# Patient Record
Sex: Female | Born: 1985 | Race: Black or African American | Hispanic: No | Marital: Single | Smoking: Current every day smoker
Health system: Southern US, Community
[De-identification: ages and names within clinical notes are randomized; demographics above are authoritative.]

## PROBLEM LIST (undated history)

## (undated) HISTORY — PX: CHOLECYSTECTOMY: SHX55

---

## 2004-09-20 ENCOUNTER — Inpatient Hospital Stay: Payer: Self-pay | Admitting: Obstetrics and Gynecology

## 2005-03-06 ENCOUNTER — Emergency Department: Payer: Self-pay | Admitting: Unknown Physician Specialty

## 2006-03-18 ENCOUNTER — Emergency Department: Payer: Self-pay | Admitting: Emergency Medicine

## 2006-05-22 ENCOUNTER — Inpatient Hospital Stay: Payer: Self-pay | Admitting: Internal Medicine

## 2006-05-25 ENCOUNTER — Emergency Department: Payer: Self-pay | Admitting: Emergency Medicine

## 2006-05-31 ENCOUNTER — Ambulatory Visit: Payer: Self-pay | Admitting: General Surgery

## 2010-05-26 ENCOUNTER — Observation Stay: Payer: Self-pay | Admitting: Obstetrics and Gynecology

## 2010-05-29 ENCOUNTER — Emergency Department: Payer: Self-pay | Admitting: Emergency Medicine

## 2010-05-31 ENCOUNTER — Ambulatory Visit: Payer: Self-pay | Admitting: Advanced Practice Midwife

## 2010-06-17 ENCOUNTER — Observation Stay: Payer: Self-pay

## 2010-07-20 ENCOUNTER — Ambulatory Visit: Payer: Self-pay | Admitting: Obstetrics and Gynecology

## 2011-06-13 ENCOUNTER — Ambulatory Visit: Payer: Self-pay | Admitting: Family Medicine

## 2011-07-24 ENCOUNTER — Observation Stay: Payer: Self-pay | Admitting: Obstetrics and Gynecology

## 2011-07-24 ENCOUNTER — Emergency Department: Payer: Self-pay | Admitting: Unknown Physician Specialty

## 2011-08-22 ENCOUNTER — Observation Stay: Payer: Self-pay | Admitting: Obstetrics and Gynecology

## 2011-09-13 ENCOUNTER — Ambulatory Visit: Payer: Self-pay | Admitting: Obstetrics and Gynecology

## 2011-09-13 LAB — CBC WITH DIFFERENTIAL/PLATELET
Basophil #: 0 10*3/uL (ref 0.0–0.1)
Basophil %: 0.1 %
HCT: 38.3 % (ref 35.0–47.0)
HGB: 12.6 g/dL (ref 12.0–16.0)
Lymphocyte #: 1.8 10*3/uL (ref 1.0–3.6)
MCV: 83 fL (ref 80–100)
Monocyte %: 7.1 %
Neutrophil #: 10.1 10*3/uL — ABNORMAL HIGH (ref 1.4–6.5)
RDW: 15.6 % — ABNORMAL HIGH (ref 11.5–14.5)

## 2011-09-14 ENCOUNTER — Inpatient Hospital Stay: Payer: Self-pay

## 2011-09-15 LAB — HEMATOCRIT: HCT: 35.1 % (ref 35.0–47.0)

## 2011-09-18 LAB — PATHOLOGY REPORT

## 2014-11-28 NOTE — Op Note (Signed)
PATIENT NAME:  Gina Newman, Gina Newman MR#:  [id number] DATE OF BIRTH:  Jan 18, 1986  DATE OF PROCEDURE:  09/14/2011  PREOPERATIVE DIAGNOSES:  1. Elective repeat cesarean section, [redacted] weeks gestation.  2. Elective permanent sterilization.   POSTOPERATIVE DIAGNOSES:  1. Elective repeat cesarean section, [redacted] weeks gestation.  2. Elective permanent sterilization.   PROCEDURES:  1. Repeat low transverse cesarean section. 2. Pomeroy bilateral tubal ligation. 3. On-Q pump system placement   ANESTHESIA: Spinal.   SURGEON: Suzy Bouchardhomas J. Hamed Debella, M.D.   FIRST ASSISTANT: Brame - scrub tech.   INDICATIONS: This is a 29 year old gravida 3, para 2, the patient is at 3739 weeks gestation. The patient elected for repeat cesarean section and elective permanent sterilization. Medicaid consent form was signed on 07/23/2011.   DESCRIPTION OF PROCEDURE:  After adequate spinal anesthesia, the patient was placed in the dorsal supine position with a hip roll under the right side. The patient's abdomen was prepped and draped in normal sterile fashion. A low transverse Pfannenstiel incision was made. Sharp dissection was used to identify the fascia, the fascia was opened in the midline and opened in a transverse fashion. The superior aspect of the fascia was grasped with Kocher clamps and the recti muscles dissected free. The inferior aspect of the fascia was grasped with Kocher clamps, and the pyramidalis muscle was dissected free. The peritoneal cavity was entered sharply. Vesicouterine peritoneal fold was identified and opened and bladder was reflected inferiorly. A low transverse uterine incision was made. Upon entry into the endometrial cavity, clear fluid resulted. The incision was extended with blunt transverse traction. The fetal head was delivered through the incision without difficulty. A vigorous female was passed to the neonatologist, Dr. Beckie Saltsasnadi, who assigned Apgar scores of 8 and 9. The placenta was manually  delivered. The uterus was exteriorized and wiped clean with laparotomy tape. A ring forceps was used to open the cervix and this was passed off the operative field. The uterine incision was closed with one chromic suture in a running locking fashion with good approximation of edges. Good hemostasis was noted. Attention was directed to the patient's right fallopian tube which was grasped at the midportion of the fallopian tube and two separate 0 plain gut sutures were placed on the fallopian tube. A 1.5 cm portion of the fallopian tube was removed. A similar procedure was repeated on the patient's left fallopian tube, after placing two separate 0 plain gut sutures, and a 1.5 cm portion of the fallopian tube was removed. There were two small areas bilaterally that were oozing, that required a figure-of-eight 3-0 Vicryl suture. Good hemostasis was noted. The posterior cul-de-sac was irrigated and suctioned. The uterus was placed back into the abdominal cavity. The paracolic gutters were wiped clean with laparotomy tape. The tubal ligation sites appeared hemostatic bilaterally. The uterine incision was hemostatic as well. Interceed was placed over the uterine incision, in a T-shaped fashion. The On-Q pump system was then brought up to the operative field and two separate catheters were placed infraumbilically. These catheters were run subfascially. Once in place, the fascia was closed over top of the catheters with 0 Vicryl suture in a running nonlocking fashion with good approximation of edges. The subcutaneous tissues were irrigated and bovied. There were some releasing incisions made essentially due to the patient's history of a prior vertical skin incision. Good hemostasis was noted. The skin was reapproximated with staples. The catheters were secured at the skin with Dermabond, secured with a Tegaderm, and were loaded  in the standard fashion with 5 mL to each catheter of 0.5% Marcaine. The patient tolerated the  procedure well. Estimated blood loss 400 mL. Intraoperative fluids 1200 mL. The patient did receive 2 grams IV of Ancef prior to commencement of the case. A Foley catheter was placed at the time of surgery as well. ____________________________ Suzy Bouchard, MD tjs:slb D: 09/14/2011 08:47:08 ET T: 09/14/2011 09:49:18 ET JOB#: [id number]  cc: Suzy Bouchard, MD, <Dictator> Suzy Bouchard MD ELECTRONICALLY SIGNED 09/14/2011 14:15

## 2014-11-28 NOTE — Discharge Summary (Signed)
PATIENT NAME:  Gina Newman, [name] MR#:  [id number] DATE OF BIRTH:  March 07, 1986  DATE OF ADMISSION:  09/14/2011 DATE OF DISCHARGE:  09/17/2011  PRINCIPLE PROCEDURES: Elective repeat cesarean section and bilateral tubal ligation.   HOSPITAL COURSE: The patient underwent the above procedure, which was uncomplicated, with a postoperative day number one hematocrit of 35.1. She was discharged to home postoperative day number three. Staples were removed and Steri-Strips applied.   DISCHARGE MEDICATIONS: Vicodin.   DISCHARGE FOLLOWUP: The patient will follow-up with Dr. Feliberto GottronSchermerhorn in two weeks for wound care or before if she has wound drainage, fever, or increasing abdominal pain.  ____________________________ Suzy Bouchardhomas J. Wallace Cogliano, MD tjs:slb D: 09/24/2011 10:56:56 ET T: 09/24/2011 11:06:09 ET JOB#: [id number]  cc: Suzy Bouchardhomas J. Allyah Heather, MD, <Dictator> Suzy BouchardHOMAS J Lael Wetherbee MD ELECTRONICALLY SIGNED 09/26/2011 9:00

## 2018-02-17 ENCOUNTER — Emergency Department
Admission: EM | Admit: 2018-02-17 | Discharge: 2018-02-17 | Disposition: A | Discharge status: Home / Self Care | Payer: Self-pay | Attending: Emergency Medicine | Admitting: Emergency Medicine

## 2018-02-17 ENCOUNTER — Other Ambulatory Visit: Payer: Self-pay

## 2018-02-17 ENCOUNTER — Emergency Department: Payer: Self-pay

## 2018-02-17 DIAGNOSIS — R0789 Other chest pain: Secondary | ICD-10-CM | POA: Insufficient documentation

## 2018-02-17 DIAGNOSIS — Z9049 Acquired absence of other specified parts of digestive tract: Secondary | ICD-10-CM | POA: Insufficient documentation

## 2018-02-17 LAB — BASIC METABOLIC PANEL
ANION GAP: 8 (ref 5–15)
BUN: 7 mg/dL (ref 6–20)
CALCIUM: 9.5 mg/dL (ref 8.9–10.3)
CHLORIDE: 108 mmol/L (ref 98–111)
CO2: 24 mmol/L (ref 22–32)
Creatinine, Ser: 0.66 mg/dL (ref 0.44–1.00)
GFR calc non Af Amer: >60 mL/min (ref >60)
GLUCOSE: 107 mg/dL — AB (ref 70–99)
Potassium: 3.9 mmol/L (ref 3.5–5.1)
Sodium: 140 mmol/L (ref 135–145)

## 2018-02-17 LAB — CBC
HCT: 38.3 % (ref 35.0–47.0)
HEMOGLOBIN: 13 g/dL (ref 12.0–16.0)
MCH: 27 pg (ref 26.0–34.0)
MCHC: 34 g/dL (ref 32.0–36.0)
MCV: 79.4 fL — AB (ref 80.0–100.0)
Platelets: 319 10*3/uL (ref 150–440)
RBC: 4.83 MIL/uL (ref 3.80–5.20)
RDW: 13.8 % (ref 11.5–14.5)
WBC: 7.7 10*3/uL (ref 3.6–11.0)

## 2018-02-17 LAB — TROPONIN I

## 2018-02-17 MED ORDER — RANITIDINE HCL 150 MG PO CAPS: 150.0000 mg | ORAL_CAPSULE | Freq: Two times a day (BID) | ORAL | 0 refills | Status: AC

## 2018-02-17 MED ORDER — METOCLOPRAMIDE HCL 10 MG PO TABS: 10.0000 mg | ORAL_TABLET | Freq: Four times a day (QID) | ORAL | 0 refills | Status: DC | PRN

## 2018-02-17 NOTE — ED Provider Notes (Signed)
[facility] Emergency Department Provider Note  ____________________________________________  Time seen: Approximately 7:50 PM  I have reviewed the triage vital signs and the nursing notes.   HISTORY  Chief Complaint Chest Pain    HPI Gina Newman is a 32 y.o. female with no significant past medical history who complains of chest pain described as tightness in the central chest, started at 10 AM today, constant, better sitting upright, not exertional, not pleuritic.  No shortness of breath diaphoresis or vomiting.  Feeling better just in the last 2 hours.  Nonradiating.  Had similar pain like this about a month ago after eating barbecue.      History reviewed. No pertinent past medical history.   There are no active problems to display for this patient.    Past Surgical History:  Procedure Laterality Date  . CHOLECYSTECTOMY       Prior to Admission medications   Medication Sig Start Date End Date Taking? Authorizing Provider  metoCLOPramide (REGLAN) 10 MG tablet Take 1 tablet (10 mg total) by mouth every 6 (six) hours as needed. 02/17/18   Sharman CheekStafford, Cinthia Rodden, MD  ranitidine (ZANTAC) 150 MG capsule Take 1 capsule (150 mg total) by mouth 2 (two) times daily. 02/17/18   Sharman CheekStafford, Leighla Chestnutt, MD     Allergies Patient has no allergy information on record.   History reviewed. No pertinent family history.  Social History Social History   Tobacco Use  . Smoking status: Never Smoker  Substance Use Topics  . Alcohol use: Never    Frequency: Never  . Drug use: Never    Review of Systems  Constitutional:   No fever or chills.  ENT:   No sore throat. No rhinorrhea. Cardiovascular:   Positive as above chest pain without  syncope. Respiratory:   No dyspnea or cough. Gastrointestinal:   Negative for abdominal pain, vomiting and diarrhea.  Musculoskeletal:   Negative for focal pain or swelling All other systems reviewed and are negative except as  documented above in ROS and HPI.  ____________________________________________   PHYSICAL EXAM:  VITAL SIGNS: ED Triage Vitals  Enc Vitals Group     BP 02/17/18 1720 132/68     Pulse Rate 02/17/18 1720 60     Resp 02/17/18 1720 18     Temp 02/17/18 1720 97.7 F (36.5 C)     Temp Source 02/17/18 1720 Oral     SpO2 02/17/18 1720 99 %     Weight 02/17/18 1719 200 lb (90.7 kg)     Height 02/17/18 1719 5\' 7"  (1.702 m)     Head Circumference --      Peak Flow --      Pain Score 02/17/18 1719 8     Pain Loc --      Pain Edu? --      Excl. in GC? --     Vital signs reviewed, nursing assessments reviewed.   Constitutional:   Alert and oriented. Non-toxic appearance. Eyes:   Conjunctivae are normal. EOMI. PERRL. ENT      Head:   Normocephalic and atraumatic.      Nose:   No congestion/rhinnorhea.       Mouth/Throat:   MMM, no pharyngeal erythema. No peritonsillar mass.       Neck:   No meningismus. Full ROM. Hematological/Lymphatic/Immunilogical:   No cervical lymphadenopathy. Cardiovascular:   RRR. Symmetric bilateral radial and DP pulses.  No murmurs. Cap refill less than 2 seconds. Respiratory:   Normal respiratory  effort without tachypnea/retractions. Breath sounds are clear and equal bilaterally. No wheezes/rales/rhonchi. Gastrointestinal:   Soft and nontender. Non distended. There is no CVA tenderness.  No rebound, rigidity, or guarding. Genitourinary:   deferred Musculoskeletal:   Normal range of motion in all extremities. No joint effusions.  No lower extremity tenderness.  No edema. Neurologic:   Normal speech and language.  Motor grossly intact. No acute focal neurologic deficits are appreciated.  Skin:    Skin is warm, dry and intact. No rash noted.  No petechiae, purpura, or bullae.  ____________________________________________    LABS (pertinent positives/negatives) (all labs ordered are listed, but only abnormal results are displayed) Labs Reviewed  BASIC  METABOLIC PANEL - Abnormal; Notable for the following components:      Result Value   Glucose, Bld 107 (*)    All other components within normal limits  CBC - Abnormal; Notable for the following components:   MCV 79.4 (*)    All other components within normal limits  TROPONIN I  POC URINE PREG, ED   ____________________________________________   EKG  Interpreted by me Normal sinus rhythm rate of 60, normal axis and intervals.  Normal QRS ST segments and T waves.  ____________________________________________    RADIOLOGY  Dg Chest 2 View  Result Date: 02/17/2018 CLINICAL DATA:  Left-sided chest pain and dyspnea today. EXAM: CHEST - 2 VIEW COMPARISON:  None. FINDINGS: The heart size and mediastinal contours are within normal limits. Both lungs are clear. The visualized skeletal structures are unremarkable. IMPRESSION: No active cardiopulmonary disease. Electronically Signed   By: Tollie Eth M.D.   On: 02/17/2018 17:53    ____________________________________________   PROCEDURES Procedures  ____________________________________________    CLINICAL IMPRESSION / ASSESSMENT AND PLAN / ED COURSE  Pertinent labs & imaging results that were available during my care of the patient were reviewed by me and considered in my medical decision making (see chart for details).    Patient presents with atypical chest pain.  Currently feeling better, vital signs are normal.  EKG unremarkable, labs and chest x-ray normal.Considering the patient's symptoms, medical history, and physical examination today, I have low suspicion for ACS, PE, TAD, pneumothorax, carditis, mediastinitis, pneumonia, CHF, or sepsis.  Most likely GERD.  Trial of Reglan and Pepcid.  Follow-up with primary care.  Return precautions.      ____________________________________________   FINAL CLINICAL IMPRESSION(S) / ED DIAGNOSES    Final diagnoses:  Atypical chest pain     ED Discharge Orders         Ordered    metoCLOPramide (REGLAN) 10 MG tablet  Every 6 hours PRN     02/17/18 1950    ranitidine (ZANTAC) 150 MG capsule  2 times daily     02/17/18 1950      Portions of this note were generated with dragon dictation software. Dictation errors may occur despite best attempts at proofreading.    Sharman Cheek, MD 02/17/18 [phone]

## 2018-02-17 NOTE — ED Triage Notes (Signed)
Pt arrives to ED for CP that began around 10am today. Pt had just woken up. States L sided. Denies radiation. Also states SOB. No SOB or increased WOB noted. Denies asthma, CHF, COPD.

## 2018-09-28 ENCOUNTER — Other Ambulatory Visit: Payer: Self-pay

## 2018-09-28 ENCOUNTER — Emergency Department
Admission: EM | Admit: 2018-09-28 | Discharge: 2018-09-28 | Disposition: A | Discharge status: Home / Self Care | Payer: Medicaid Other | Attending: Emergency Medicine | Admitting: Emergency Medicine

## 2018-09-28 DIAGNOSIS — J101 Influenza due to other identified influenza virus with other respiratory manifestations: Secondary | ICD-10-CM

## 2018-09-28 DIAGNOSIS — J1089 Influenza due to other identified influenza virus with other manifestations: Secondary | ICD-10-CM | POA: Insufficient documentation

## 2018-09-28 DIAGNOSIS — F17210 Nicotine dependence, cigarettes, uncomplicated: Secondary | ICD-10-CM | POA: Insufficient documentation

## 2018-09-28 LAB — INFLUENZA PANEL BY PCR (TYPE A & B)
INFLBPCR: NEGATIVE
Influenza A By PCR: POSITIVE — AB

## 2018-09-28 MED ORDER — BUTALBITAL-APAP-CAFFEINE 50-325-40 MG PO TABS
1.0000 | ORAL_TABLET | Freq: Once | ORAL | Status: AC
  Administered 2018-09-28: 1 via ORAL
  Filled 2018-09-28: qty 1

## 2018-09-28 MED ORDER — BUTALBITAL-APAP-CAFFEINE 50-325-40 MG PO TABS: 1.0000 | ORAL_TABLET | Freq: Four times a day (QID) | ORAL | 0 refills | Status: AC | PRN

## 2018-09-28 NOTE — ED Triage Notes (Signed)
Patient to ED for flu like symptoms for 1 week. Son at home who is 35 years old was treated last week for type A. Patient states she has body aches and nausea but denies diarrhea. Decreased appetite over the last day.

## 2018-09-28 NOTE — ED Provider Notes (Signed)
[facility] Emergency Department Provider Note  ____________________________________________  Time seen: Approximately 9:13 PM  I have reviewed the triage vital signs and the nursing notes.   HISTORY  Chief Complaint Influenza (x 1 week)   HPI Tinea N Mears is a 33 y.o. female presents to the emergency department for treatment and evaluation for flulike symptoms for the past week or so.  She states that overall the symptoms have improved with the exception of the headache.  She states that her head hurts much worse when she coughs.  She states that the cough is becoming less frequent and she is becoming less congested.  She has taken Tylenol, ibuprofen, NyQuil, TheraFlu, and DayQuil without relief of the headache.    History reviewed. No pertinent past medical history.  There are no active problems to display for this patient.   Past Surgical History:  Procedure Laterality Date  . CHOLECYSTECTOMY      Prior to Admission medications   Medication Sig Start Date End Date Taking? Authorizing Provider  butalbital-acetaminophen-caffeine (FIORICET, ESGIC) 50-325-40 MG tablet Take 1 tablet by mouth every 6 (six) hours as needed for up to 3 days for headache. 09/28/18 10/01/18  Katalin Colledge, Rulon Eisenmenger B, FNP  metoCLOPramide (REGLAN) 10 MG tablet Take 1 tablet (10 mg total) by mouth every 6 (six) hours as needed. 02/17/18   Sharman Cheek, MD  ranitidine (ZANTAC) 150 MG capsule Take 1 capsule (150 mg total) by mouth 2 (two) times daily. 02/17/18   Sharman Cheek, MD    Allergies Patient has no known allergies.  No family history on file.  Social History Social History   Tobacco Use  . Smoking status: Current Every Day Smoker  Substance Use Topics  . Alcohol use: Not Currently    Frequency: Never  . Drug use: Never    Review of Systems Constitutional: Negative for fever/chills.  Normal appetite. ENT: Negative for sore throat. Cardiovascular: Denies chest  pain. Respiratory: Negative for shortness of breath.  Positive for cough.  Negative wheezing.  Gastrointestinal: Negative for nausea, no vomiting.  Negative for diarrhea.  Musculoskeletal: Positive for body aches Skin: Negative for rash. Neurological: Positive for headaches ____________________________________________   PHYSICAL EXAM:  VITAL SIGNS: ED Triage Vitals  Enc Vitals Group     BP 09/28/18 1953 125/66     Pulse Rate 09/28/18 1953 80     Resp 09/28/18 1953 18     Temp 09/28/18 1953 98.6 F (37 C)     Temp Source 09/28/18 1953 Oral     SpO2 09/28/18 1953 98 %     Weight 09/28/18 1954 200 lb (90.7 kg)     Height 09/28/18 1954 5\' 6"  (1.676 m)     Head Circumference --      Peak Flow --      Pain Score 09/28/18 1953 4     Pain Loc --      Pain Edu? --      Excl. in GC? --     Constitutional: Alert and oriented.  Well appearing and in no acute distress. Eyes: Conjunctivae are normal. Ears: Bilateral tympanic membranes are normal Nose: Maxillary sinus congestion noted; no rhinnorhea. Mouth/Throat: Mucous membranes are moist.  Oropharynx clear. Tonsils not visualized. Uvula midline. Neck: No stridor.  Lymphatic: No cervical lymphadenopathy. Cardiovascular: Normal rate, regular rhythm. Good peripheral circulation. Respiratory: Respirations are even and unlabored.  No retractions.  Breath sounds clear to auscultation. Gastrointestinal: Soft and nontender.  Musculoskeletal: FROM x 4 extremities.  Neurologic:  Normal speech and language. Skin:  Skin is warm, dry and intact. No rash noted. Psychiatric: Mood and affect are normal. Speech and behavior are normal.  ____________________________________________   LABS (all labs ordered are listed, but only abnormal results are displayed)  Labs Reviewed  INFLUENZA PANEL BY PCR (TYPE A & B) - Abnormal; Notable for the following components:      Result Value   Influenza A By PCR POSITIVE (*)    All other components within  normal limits   ____________________________________________  EKG  Not indicated ____________________________________________  RADIOLOGY  Not indicated ____________________________________________   PROCEDURES  Procedure(s) performed: None  Critical Care performed: No ____________________________________________   INITIAL IMPRESSION / ASSESSMENT AND PLAN / ED COURSE  33 y.o. female presents to the emergency department for treatment and evaluation of URIs and flulike symptoms.  Her son has had the flu this past week.  Her symptoms developed almost a week ago as well and have nearly resolved with the exception of some lingering cough and congestion and headache.  Headache will be treated with Fioricet.  She was offered cough meds but declines and states that she can just take over-the-counter meds which has helped.  Patient was encouraged to see primary care for symptoms that are not improving over the next few days.  She was encouraged to return to the emergency department if the headache changes or worsens.    Medications  butalbital-acetaminophen-caffeine (FIORICET, ESGIC) 50-325-40 MG per tablet 1 tablet (1 tablet Oral Given 09/28/18 2113)    ED Discharge Orders         Ordered    butalbital-acetaminophen-caffeine Marikay Alar, ESGIC) 50-325-40 MG tablet  Every 6 hours PRN     09/28/18 2109           Pertinent labs & imaging results that were available during my care of the patient were reviewed by me and considered in my medical decision making (see chart for details).    If controlled substance prescribed during this visit, 12 month history viewed on the [facility] prior to issuing an initial prescription for Schedule II or III opiod. ____________________________________________   FINAL CLINICAL IMPRESSION(S) / ED DIAGNOSES  Final diagnoses:  Influenza A    Note:  This document was prepared using Dragon voice recognition software and may include unintentional  dictation errors.     Chinita Pester, FNP 09/28/18 2116    Sharman Cheek, MD 10/03/18 [phone]

## 2020-02-20 IMAGING — CR DG CHEST 2V
2 series · 2 of 2 positions shown · non-contrast
Comparison: None.

CLINICAL DATA: Left-sided chest pain and dyspnea today.

EXAM:
CHEST - 2 VIEW

[chest pa]
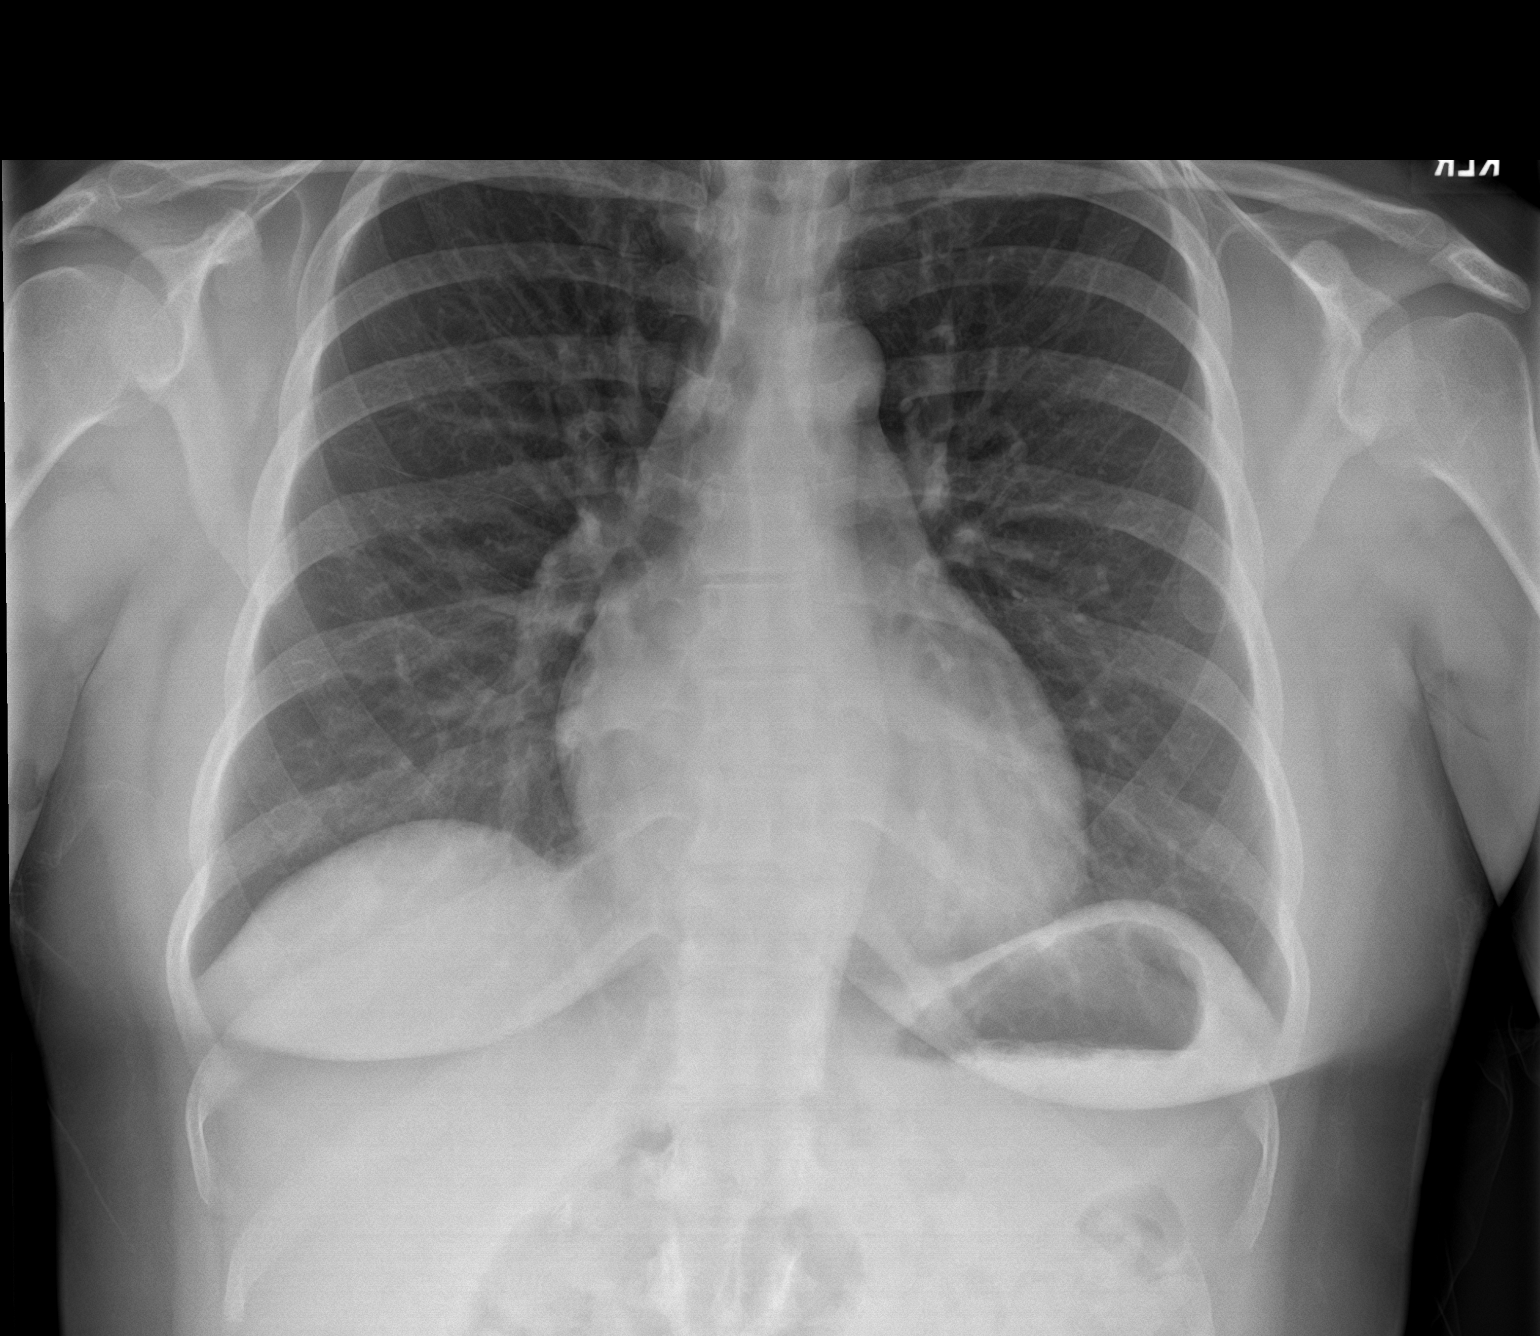

[chest lat]
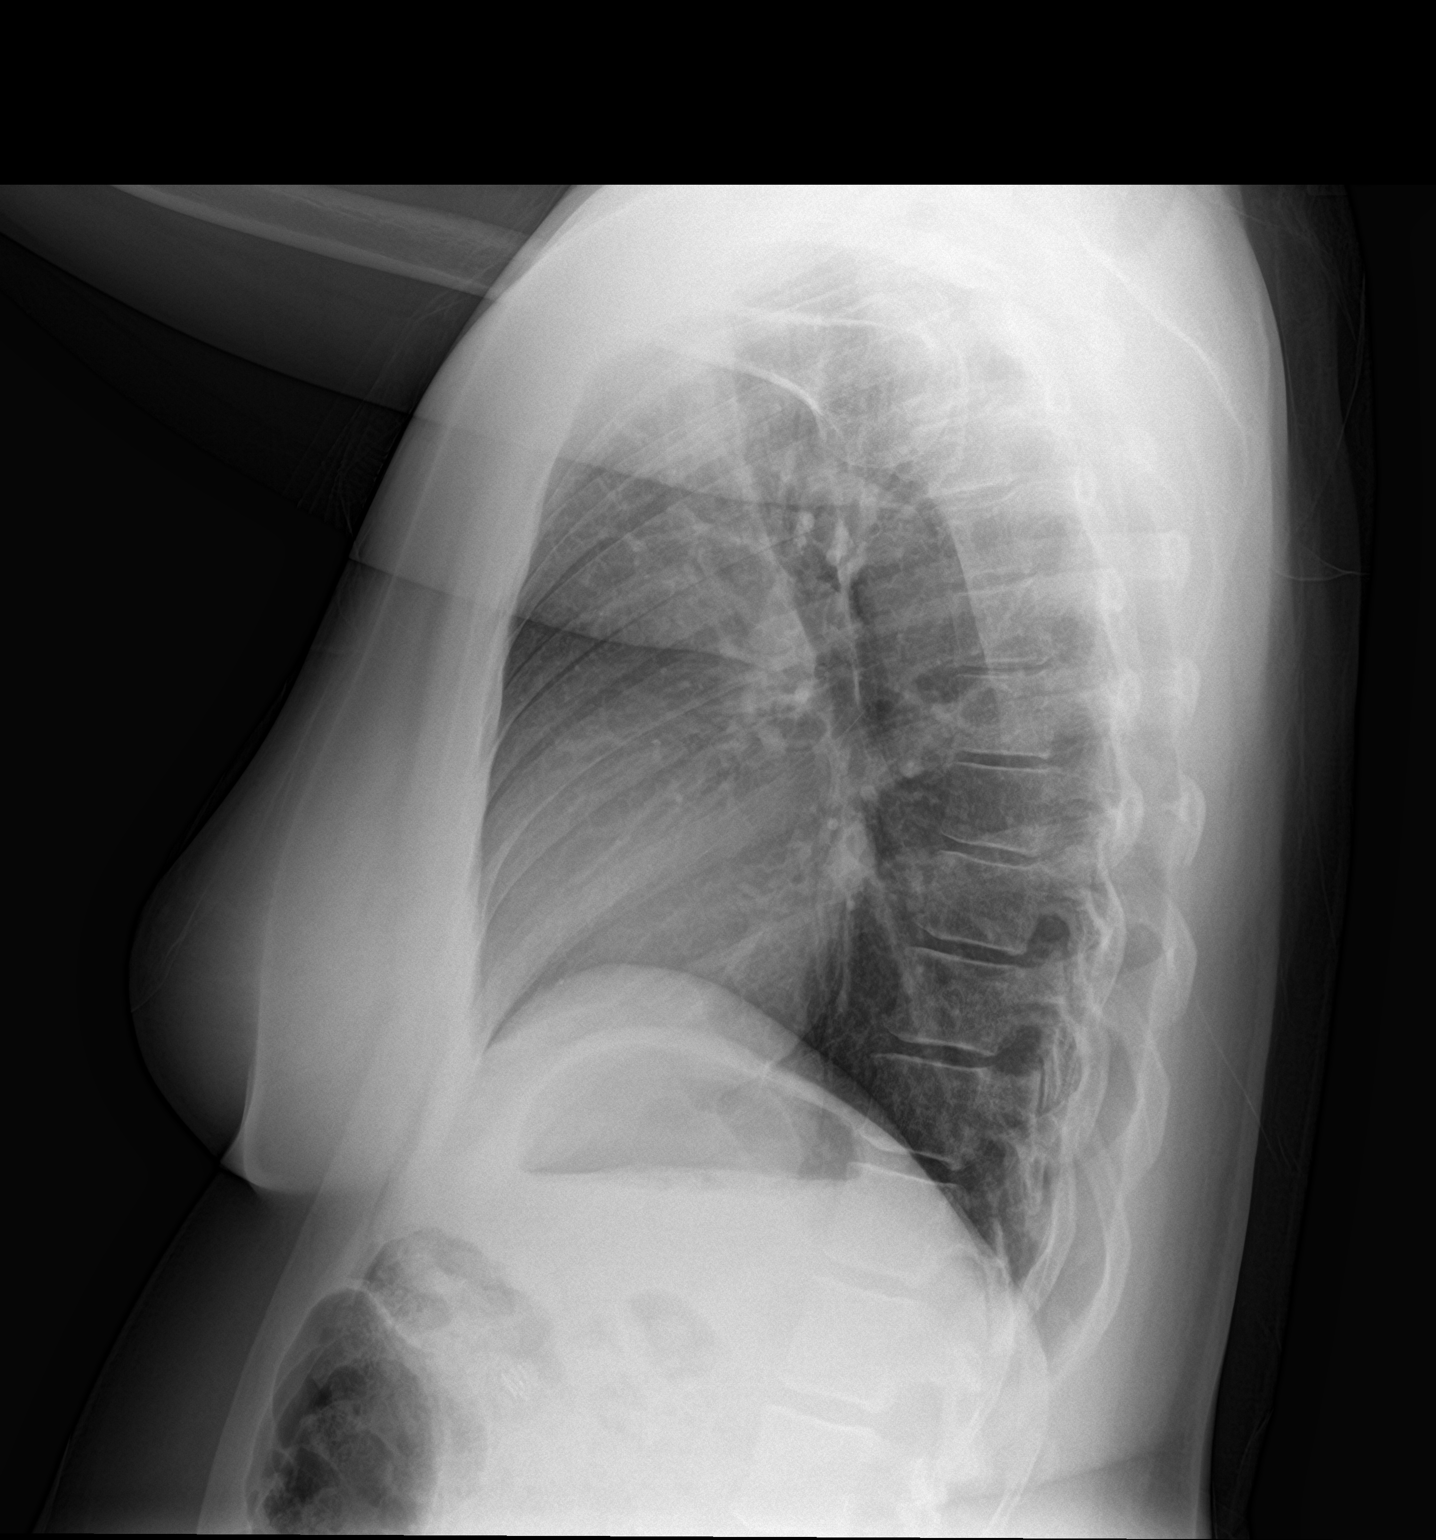

[2 of 2 positions shown; findings below may reference images not displayed]

FINDINGS: The heart size and mediastinal contours are within normal limits.
Both lungs are clear. The visualized skeletal structures are
unremarkable.
IMPRESSION: No active cardiopulmonary disease.

## 2020-07-16 ENCOUNTER — Emergency Department
Admission: EM | Admit: 2020-07-16 | Discharge: 2020-07-16 | Disposition: A | Discharge status: Home / Self Care | Payer: Medicaid Other | Attending: Emergency Medicine | Admitting: Emergency Medicine

## 2020-07-16 ENCOUNTER — Other Ambulatory Visit: Payer: Self-pay

## 2020-07-16 ENCOUNTER — Emergency Department: Payer: Medicaid Other

## 2020-07-16 ENCOUNTER — Encounter: Payer: Self-pay | Admitting: Emergency Medicine

## 2020-07-16 DIAGNOSIS — R6883 Chills (without fever): Secondary | ICD-10-CM | POA: Diagnosis present

## 2020-07-16 DIAGNOSIS — Z5321 Procedure and treatment not carried out due to patient leaving prior to being seen by health care provider: Secondary | ICD-10-CM | POA: Insufficient documentation

## 2020-07-16 DIAGNOSIS — U071 COVID-19: Secondary | ICD-10-CM | POA: Insufficient documentation

## 2020-07-16 DIAGNOSIS — M791 Myalgia, unspecified site: Secondary | ICD-10-CM | POA: Insufficient documentation

## 2020-07-16 DIAGNOSIS — R531 Weakness: Secondary | ICD-10-CM | POA: Insufficient documentation

## 2020-07-16 DIAGNOSIS — F17200 Nicotine dependence, unspecified, uncomplicated: Secondary | ICD-10-CM | POA: Diagnosis not present

## 2020-07-16 DIAGNOSIS — R509 Fever, unspecified: Secondary | ICD-10-CM | POA: Diagnosis present

## 2020-07-16 LAB — RESP PANEL BY RT-PCR (FLU A&B, COVID) ARPGX2
Influenza A by PCR: NEGATIVE
Influenza B by PCR: NEGATIVE
SARS Coronavirus 2 by RT PCR: POSITIVE — AB

## 2020-07-16 MED ORDER — DICYCLOMINE HCL 10 MG PO CAPS: 10.0000 mg | ORAL_CAPSULE | Freq: Three times a day (TID) | ORAL | 0 refills | Status: AC

## 2020-07-16 MED ORDER — ONDANSETRON 4 MG PO TBDP: 4.0000 mg | ORAL_TABLET | Freq: Three times a day (TID) | ORAL | 0 refills | Status: AC | PRN

## 2020-07-16 NOTE — ED Notes (Signed)
Th RN was speaking to a different patient and this patient asked front desk staff to cut her bracelet off because she was leaving.

## 2020-07-16 NOTE — ED Triage Notes (Signed)
Fever, cough, body aches since Wednesday.  AAOx3.  Skin warm and dry. NAD

## 2020-07-16 NOTE — ED Notes (Addendum)
Pt c/o cough producing green phlegm, chills, body aches, and fatigue since Thanksgiving. Pt states son had similar s/s. Pt denies SOB, N/V/D. Pt AOX4, NAD noted. Lung sounds clear bilaterally, no cough noted at this time. Nasal congestion noted

## 2020-07-16 NOTE — ED Notes (Signed)
Annice Pih PA notified of +COVID

## 2020-07-16 NOTE — ED Provider Notes (Signed)
Emergency Department Provider Note  ____________________________________________  Time seen: Approximately 3:12 PM  I have reviewed the triage vital signs and the nursing notes.   HISTORY  Chief Complaint Fever and Cough   Historian Patient     HPI Gina Newman is a 34 y.o. female presents to the emergency department with productive cough, chills, body aches and fatigue.  Patient states her symptoms originally started around Thanksgiving and seemed to improve but she states that symptoms have worsened again.  She denies vomiting or diarrhea.  No chest pain, chest tightness or shortness of breath.  No recent travel.  There are sick contacts in the home with similar symptoms.   No past medical history on file.   Immunizations up to date:  Yes.     No past medical history on file.  There are no problems to display for this patient.   Past Surgical History:  Procedure Laterality Date  . CHOLECYSTECTOMY      Prior to Admission medications   Medication Sig Start Date End Date Taking? Authorizing Provider  dicyclomine (BENTYL) 10 MG capsule Take 1 capsule (10 mg total) by mouth 4 (four) times daily -  before meals and at bedtime for 5 days. 07/16/20 07/21/20  Orvil Feil, PA-C  metoCLOPramide (REGLAN) 10 MG tablet Take 1 tablet (10 mg total) by mouth every 6 (six) hours as needed. 02/17/18   Sharman Cheek, MD  ondansetron (ZOFRAN ODT) 4 MG disintegrating tablet Take 1 tablet (4 mg total) by mouth every 8 (eight) hours as needed for up to 5 days. 07/16/20 07/21/20  Orvil Feil, PA-C  ranitidine (ZANTAC) 150 MG capsule Take 1 capsule (150 mg total) by mouth 2 (two) times daily. 02/17/18   Sharman Cheek, MD    Allergies Patient has no known allergies.  No family history on file.  Social History Social History   Tobacco Use  . Smoking status: Current Every Day Smoker  . Smokeless tobacco: Never Used  Substance Use Topics  . Alcohol use: Not  Currently  . Drug use: Never     Review of Systems  Constitutional: Patient has fever.  Eyes: No visual changes. No discharge ENT: Patient has congestion.  Cardiovascular: no chest pain. Respiratory: Patient has cough.  Gastrointestinal: No abdominal pain.  No nausea, no vomiting. Patient had diarrhea.  Genitourinary: Negative for dysuria. No hematuria Musculoskeletal: Patient has myalgias.  Skin: Negative for rash, abrasions, lacerations, ecchymosis. Neurological: Patient has headache, no focal weakness or numbness.     ____________________________________________   PHYSICAL EXAM:  VITAL SIGNS: ED Triage Vitals  Enc Vitals Group     BP 07/16/20 1433 113/65     Pulse Rate 07/16/20 1433 70     Resp 07/16/20 1433 16     Temp 07/16/20 1433 98.7 F (37.1 C)     Temp Source 07/16/20 1433 Oral     SpO2 07/16/20 1433 97 %     Weight 07/16/20 1432 220 lb 0.3 oz (99.8 kg)     Height 07/16/20 1432 5\' 7"  (1.702 m)     Head Circumference --      Peak Flow --      Pain Score 07/16/20 1431 5     Pain Loc --      Pain Edu? --      Excl. in GC? --      Constitutional: Alert and oriented. Patient is lying supine. Eyes: Conjunctivae are normal. PERRL. EOMI. Head: Atraumatic. ENT:  Ears: Tympanic membranes are mildly injected with mild effusion bilaterally.       Nose: No congestion/rhinnorhea.      Mouth/Throat: Mucous membranes are moist. Posterior pharynx is mildly erythematous.  Hematological/Lymphatic/Immunilogical: No cervical lymphadenopathy.  Cardiovascular: Normal rate, regular rhythm. Normal S1 and S2.  Good peripheral circulation. Respiratory: Normal respiratory effort without tachypnea or retractions. Lungs CTAB. Good air entry to the bases with no decreased or absent breath sounds. Gastrointestinal: Bowel sounds 4 quadrants. Soft and nontender to palpation. No guarding or rigidity. No palpable masses. No distention. No CVA tenderness. Musculoskeletal: Full  range of motion to all extremities. No gross deformities appreciated. Neurologic:  Normal speech and language. No gross focal neurologic deficits are appreciated.  Skin:  Skin is warm, dry and intact. No rash noted. Psychiatric: Mood and affect are normal. Speech and behavior are normal. Patient exhibits appropriate insight and judgement.    ____________________________________________   LABS (all labs ordered are listed, but only abnormal results are displayed)  Labs Reviewed  RESP PANEL BY RT-PCR (FLU A&B, COVID) ARPGX2 - Abnormal; Notable for the following components:      Result Value   SARS Coronavirus 2 by RT PCR POSITIVE (*)    All other components within normal limits   ____________________________________________  EKG   ____________________________________________  RADIOLOGY Geraldo Pitter, personally viewed and evaluated these images (plain radiographs) as part of my medical decision making, as well as reviewing the written report by the radiologist.    DG Chest 1 View  Result Date: 07/16/2020 CLINICAL DATA:  Productive cough. EXAM: CHEST  1 VIEW COMPARISON:  February 17, 2018. FINDINGS: The heart size and mediastinal contours are within normal limits. Both lungs are clear. No pneumothorax or pleural effusion is noted. The visualized skeletal structures are unremarkable. IMPRESSION: No active disease. Electronically Signed   By: Lupita Raider M.D.   On: 07/16/2020 15:46    ____________________________________________    PROCEDURES  Procedure(s) performed:     Procedures     Medications - No data to display   ____________________________________________   INITIAL IMPRESSION / ASSESSMENT AND PLAN / ED COURSE  Pertinent labs & imaging results that were available during my care of the patient were reviewed by me and considered in my medical decision making (see chart for details).    Assessment and plan COVID-13 34 year old female presents to the  emergency department with low-grade fever, cough and body aches that started worsening on Wednesday.  Vital signs are reassuring in triage.  On physical exam, patient was alert, active and nontoxic-appearing with no increased work of breathing.  No consolidations, opacities or infiltrates were visualized on chest x-ray.  We will treat patient with Zofran and Bentyl at home if patient were to experience nausea or abdominal spasms secondary to Covid.  Rest and hydration were encouraged.  Tylenol and ibuprofen alternating were recommended for fever.  All patient questions were answered.    ____________________________________________  FINAL CLINICAL IMPRESSION(S) / ED DIAGNOSES  Final diagnoses:  COVID-19      NEW MEDICATIONS STARTED DURING THIS VISIT:  ED Discharge Orders         Ordered    ondansetron (ZOFRAN ODT) 4 MG disintegrating tablet  Every 8 hours PRN        07/16/20 1659    dicyclomine (BENTYL) 10 MG capsule  3 times daily before meals & bedtime        07/16/20 1659  This chart was dictated using voice recognition software/Dragon. Despite best efforts to proofread, errors can occur which can change the meaning. Any change was purely unintentional.     Orvil Feil, PA-C 07/16/20 1705    Sharman Cheek, MD 07/16/20 [phone]

## 2020-07-16 NOTE — ED Triage Notes (Signed)
Pt to triage states she has been feeling "sick" since Tuesday.  States chills, body aches, and feeling weak.  States has been taking meds at home without relief.  States was unable to go to work today.

## 2020-07-17 ENCOUNTER — Telehealth (HOSPITAL_COMMUNITY): Payer: Self-pay | Admitting: Nurse Practitioner

## 2020-07-17 DIAGNOSIS — U071 COVID-19: Secondary | ICD-10-CM

## 2020-07-17 NOTE — Telephone Encounter (Signed)
Called to Discuss with patient about Covid symptoms and the use of a monoclonal antibody infusion for those with mild to moderate Covid symptoms and at a high risk of hospitalization.     Pt is qualified for this infusion at the [facility] due to co-morbid conditions and/or a member of an at-risk group.     Unable to reach pt. Left message to return call.   Jaculin Rasmus, DNP, AGNP-C [phone] (Infusion Center Hotline)  

## 2023-07-11 ENCOUNTER — Other Ambulatory Visit: Payer: Self-pay

## 2023-07-11 ENCOUNTER — Encounter: Payer: Self-pay | Admitting: Medical Oncology

## 2023-07-11 ENCOUNTER — Emergency Department
Admission: EM | Admit: 2023-07-11 | Discharge: 2023-07-11 | Disposition: A | Discharge status: Home / Self Care | Payer: No Typology Code available for payment source | Attending: Emergency Medicine | Admitting: Emergency Medicine

## 2023-07-11 DIAGNOSIS — R519 Headache, unspecified: Secondary | ICD-10-CM | POA: Diagnosis not present

## 2023-07-11 DIAGNOSIS — S5002XA Contusion of left elbow, initial encounter: Secondary | ICD-10-CM | POA: Diagnosis not present

## 2023-07-11 DIAGNOSIS — Y92410 Unspecified street and highway as the place of occurrence of the external cause: Secondary | ICD-10-CM | POA: Insufficient documentation

## 2023-07-11 DIAGNOSIS — S59902A Unspecified injury of left elbow, initial encounter: Secondary | ICD-10-CM | POA: Diagnosis present

## 2023-07-11 NOTE — Discharge Instructions (Signed)
You did not wish to have any imaging today.  You may take Tylenol/ibuprofen per package instructions to help with your symptoms.  Please return for any new, worsening, or change in symptoms or other concerns.  It was a pleasure caring for you today.
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
## 2023-09-15 ENCOUNTER — Telehealth: Payer: Medicaid Other | Admitting: Family

## 2023-09-15 DIAGNOSIS — N898 Other specified noninflammatory disorders of vagina: Secondary | ICD-10-CM

## 2023-09-15 DIAGNOSIS — B3731 Acute candidiasis of vulva and vagina: Secondary | ICD-10-CM | POA: Diagnosis not present

## 2023-09-15 MED ORDER — FLUCONAZOLE 150 MG PO TABS: 150.0000 mg | ORAL_TABLET | ORAL | 0 refills | Status: AC | PRN

## 2023-09-15 NOTE — Progress Notes (Signed)

## 2023-11-29 ENCOUNTER — Encounter

## 2023-12-05 ENCOUNTER — Telehealth: Admitting: Nurse Practitioner

## 2023-12-05 DIAGNOSIS — T7840XA Allergy, unspecified, initial encounter: Secondary | ICD-10-CM

## 2023-12-05 NOTE — Progress Notes (Signed)
   Thank you for the details you included in the comment boxes. Those details are very helpful in determining the best course of treatment for you and help us  to provide the best care.Because of your shortness of breath without a pre existing diagnosis of asthma, we recommend that you schedule a Virtual Urgent Care video visit in order for the provider to better assess what is going on.  The provider will be able to give you a more accurate diagnosis and treatment plan if we can more freely discuss your symptoms and with the addition of a virtual examination.   If you change your visit to a video visit, we will bill your insurance (similar to an office visit) and you will not be charged for this e-Visit. You will be able to stay at home and speak with the first available [facility] advanced practice provider. The link to do a video visit is in the drop down Menu tab of your Welcome screen in MyChart.

## 2023-12-06 ENCOUNTER — Telehealth: Admitting: Family Medicine

## 2023-12-06 DIAGNOSIS — J301 Allergic rhinitis due to pollen: Secondary | ICD-10-CM

## 2023-12-06 MED ORDER — PREDNISONE 20 MG PO TABS: 20.0000 mg | ORAL_TABLET | Freq: Two times a day (BID) | ORAL | 0 refills | Status: AC

## 2023-12-06 NOTE — Progress Notes (Signed)
 E visit for Allergic Rhinitis We are sorry that you are not feeling well.  Here is how we plan to help!  Based on what you have shared with me it looks like you have Allergic Rhinitis.  Rhinitis is when a reaction occurs that causes nasal congestion, runny nose, sneezing, and itching.  Most types of rhinitis are caused by an inflammation and are associated with symptoms in the eyes ears or throat. There are several types of rhinitis.  The most common are acute rhinitis, which is usually caused by a viral illness, allergic or seasonal rhinitis, and nonallergic or year-round rhinitis.  Nasal allergies occur certain times of the year.  Allergic rhinitis is caused when allergens in the air trigger the release of histamine in the body.  Histamine causes itching, swelling, and fluid to build up in the fragile linings of the nasal passages, sinuses and eyelids.  An itchy nose and clear discharge are common.  I recommend the following over the counter treatments: You should take a daily dose of antihistamine  I have sent prednisone to add to your current regimen.   HOME CARE:  You can use an over-the-counter saline nasal spray as needed Avoid areas where there is heavy dust, mites, or molds Stay indoors on windy days during the pollen season Keep windows closed in home, at least in bedroom; use air conditioner. Use high-efficiency house air filter Keep windows closed in car, turn AC on re-circulate Avoid playing out with dog during pollen season  GET HELP RIGHT AWAY IF:  If your symptoms do not improve within 10 days You become short of breath You develop yellow or green discharge from your nose for over 3 days You have coughing fits  MAKE SURE YOU:  Understand these instructions Will watch your condition Will get help right away if you are not doing well or get worse  Thank you for choosing an e-visit. Your e-visit answers were reviewed by a board certified advanced clinical practitioner  to complete your personal care plan. Depending upon the condition, your plan could have included both over the counter or prescription medications. Please review your pharmacy choice. Be sure that the pharmacy you have chosen is open so that you can pick up your prescription now.  If there is a problem you may message your provider in MyChart to have the prescription routed to another pharmacy. Your safety is important to us . If you have drug allergies check your prescription carefully.  For the next 24 hours, you can use MyChart to ask questions about today's visit, request a non-urgent call back, or ask for a work or school excuse from your e-visit provider. You will get an email in the next two days asking about your experience. I hope that your e-visit has been valuable and will speed your recovery.   have provided 5 minutes of non face to face time during this encounter for chart review and documentation.

## 2024-07-14 ENCOUNTER — Telehealth: Admitting: Physician Assistant

## 2024-07-14 DIAGNOSIS — L309 Dermatitis, unspecified: Secondary | ICD-10-CM

## 2024-07-14 MED ORDER — PREDNISONE 20 MG PO TABS: 40.0000 mg | ORAL_TABLET | Freq: Every day | ORAL | 0 refills | Status: AC

## 2024-07-14 MED ORDER — TRIAMCINOLONE ACETONIDE 0.1 % EX CREA: 1.0000 | TOPICAL_CREAM | Freq: Two times a day (BID) | CUTANEOUS | 0 refills | Status: AC

## 2024-07-14 NOTE — Progress Notes (Signed)
